# Patient Record
Sex: Male | Born: 1985 | Race: White | Hispanic: No | Marital: Married | Smoking: Current every day smoker
Health system: Southern US, Community
[De-identification: ages and names within clinical notes are randomized; demographics above are authoritative.]

## PROBLEM LIST (undated history)

## (undated) HISTORY — PX: HERNIA REPAIR: SHX51

---

## 2014-07-26 ENCOUNTER — Emergency Department (HOSPITAL_BASED_OUTPATIENT_CLINIC_OR_DEPARTMENT_OTHER)
Admission: EM | Admit: 2014-07-26 | Discharge: 2014-07-26 | Disposition: A | Discharge status: Home / Self Care | Payer: BC Managed Care – PPO | Attending: Emergency Medicine | Admitting: Emergency Medicine

## 2014-07-26 ENCOUNTER — Encounter (HOSPITAL_BASED_OUTPATIENT_CLINIC_OR_DEPARTMENT_OTHER): Payer: Self-pay | Admitting: Emergency Medicine

## 2014-07-26 DIAGNOSIS — Z792 Long term (current) use of antibiotics: Secondary | ICD-10-CM | POA: Insufficient documentation

## 2014-07-26 DIAGNOSIS — F17200 Nicotine dependence, unspecified, uncomplicated: Secondary | ICD-10-CM | POA: Diagnosis not present

## 2014-07-26 DIAGNOSIS — H5789 Other specified disorders of eye and adnexa: Secondary | ICD-10-CM | POA: Diagnosis present

## 2014-07-26 DIAGNOSIS — H109 Unspecified conjunctivitis: Secondary | ICD-10-CM | POA: Diagnosis not present

## 2014-07-26 DIAGNOSIS — Z9889 Other specified postprocedural states: Secondary | ICD-10-CM | POA: Insufficient documentation

## 2014-07-26 MED ORDER — TETRACAINE HCL 0.5 % OP SOLN
2.0000 [drp] | Freq: Once | OPHTHALMIC | Status: AC
  Administered 2014-07-26: 2 [drp] via OPHTHALMIC
  Filled 2014-07-26: qty 2

## 2014-07-26 MED ORDER — MOXIFLOXACIN HCL 0.5 % OP SOLN: 1.0000 [drp] | Freq: Three times a day (TID) | OPHTHALMIC | Status: AC

## 2014-07-26 MED ORDER — FLUORESCEIN SODIUM 1 MG OP STRP
1.0000 | ORAL_STRIP | Freq: Once | OPHTHALMIC | Status: AC
Start: 2014-07-26 — End: 2014-07-26
  Administered 2014-07-26: 1 via OPHTHALMIC
  Filled 2014-07-26: qty 1

## 2014-07-26 NOTE — ED Notes (Signed)
Pt c/o bil eye redness and drainage x 2 days

## 2014-07-26 NOTE — ED Notes (Signed)
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

## 2014-07-26 NOTE — ED Provider Notes (Signed)
CSN: [id number]     Arrival date & time 07/26/14  1914 History  This chart was scribed for Rolan Bucco, MD by Luisa Dago, ED Scribe. This patient was seen in room MH02/MH02 and the patient's care was started at 9:25 PM.    Chief Complaint  Patient presents with  . Eye Drainage   The history is provided by the patient. No language interpreter was used.   HPI Comments: Darrell Sharp is a 28 y.o. male who presents to the Emergency Department complaining of bilateral eye drainage that started approximately 2 days ago.  Pt states that this morning he woke up with crusting to both eyes. However, the day prior he had moderate puss drainage from his left eye.  He endorses associated bilateral eye redness, "glossy" vision to his right eye, and bilateral eye pain that he describes as a "soreness".  Denies any foreign bodies to bilateral eyes, known sick contacts, fever, chills, nausea, wearing contacts, nausea, emesis, chest pain, SOB.   History reviewed. No pertinent past medical history. Past Surgical History  Procedure Laterality Date  . Hernia repair     History reviewed. No pertinent family history. History  Substance Use Topics  . Smoking status: Current Every Day Smoker -- 1.00 packs/day    Types: Cigarettes  . Smokeless tobacco: Not on file  . Alcohol Use: No    Review of Systems  Constitutional: Negative for fever.  HENT: Negative for congestion, postnasal drip, rhinorrhea and sinus pressure.   Eyes: Positive for photophobia, discharge, redness, itching and visual disturbance.  Gastrointestinal: Negative for nausea and vomiting.  Neurological: Negative for dizziness and headaches.      Allergies  Review of patient's allergies indicates no known allergies.  Home Medications   Prior to Admission medications   Medication Sig Start Date End Date Taking? Authorizing Provider  moxifloxacin (VIGAMOX) 0.5 % ophthalmic solution Place 1 drop into both eyes 3 (three) times daily.  07/26/14   Rolan Bucco, MD   Triage vitals:BP 153/80  Pulse 94  Temp(Src) 98 F (36.7 C) (Oral)  Resp 18  Ht 6' (1.829 m)  Wt 170 lb (77.111 kg)  BMI 23.05 kg/m2  SpO2 100%  Physical Exam  HENT:  Head: Normocephalic and atraumatic.  Mouth/Throat: Oropharynx is clear and moist.  No facial rashes  Eyes:  Patient has some erythema to the conjunctiva bilaterally. There some yellow crusting in the corners of both eyes. Tetracaine and 4 seen was placed in the eyes. There is new staining areas. Slit-lamp exam was performed. There is no dendrites.    ED Course  Procedures (including critical care time)  DIAGNOSTIC STUDIES: Oxygen Saturation is 100% on RA, normal by my interpretation.    COORDINATION OF CARE: 9:30 PM- will order a visual acuity test. Pt advised of plan for treatment and pt agrees.  Labs Review Labs Reviewed - No data to display  Imaging Review No results found.   EKG Interpretation None      MDM   Final diagnoses:  Bilateral conjunctivitis    Patient with symptoms suggestive of conjunctivitis. He was started on vigamox eye drops. His visual acuity was 2030 in the right and 20/50 in the left. I gave him a referral to follow up with ophthalmology if his symptoms are not improving in the next 48 hours.  I personally performed the services described in this documentation, which was scribed in my presence. The recorded information has been reviewed and is accurate.    Shawna Orleans  Fredderick Phenix, MD 07/27/14 0981

## 2014-07-26 NOTE — Discharge Instructions (Signed)

## 2016-11-28 ENCOUNTER — Other Ambulatory Visit: Payer: Self-pay | Admitting: Physician Assistant

## 2016-11-28 DIAGNOSIS — K5792 Diverticulitis of intestine, part unspecified, without perforation or abscess without bleeding: Secondary | ICD-10-CM

## 2016-11-28 DIAGNOSIS — R195 Other fecal abnormalities: Secondary | ICD-10-CM

## 2016-11-28 DIAGNOSIS — R1032 Left lower quadrant pain: Secondary | ICD-10-CM

## 2016-11-28 DIAGNOSIS — R1013 Epigastric pain: Secondary | ICD-10-CM

## 2016-12-03 ENCOUNTER — Ambulatory Visit
Admission: RE | Admit: 2016-12-03 | Discharge: 2016-12-03 | Disposition: A | Discharge status: Home / Self Care | Payer: BLUE CROSS/BLUE SHIELD | Source: Ambulatory Visit | Attending: Physician Assistant | Admitting: Physician Assistant

## 2016-12-03 DIAGNOSIS — R195 Other fecal abnormalities: Secondary | ICD-10-CM

## 2016-12-03 DIAGNOSIS — K5792 Diverticulitis of intestine, part unspecified, without perforation or abscess without bleeding: Secondary | ICD-10-CM

## 2016-12-03 DIAGNOSIS — R1032 Left lower quadrant pain: Secondary | ICD-10-CM

## 2016-12-03 DIAGNOSIS — R1013 Epigastric pain: Secondary | ICD-10-CM

## 2016-12-03 MED ORDER — IOPAMIDOL (ISOVUE-300) INJECTION 61%
100.0000 mL | Freq: Once | INTRAVENOUS | Status: AC | PRN
  Administered 2016-12-03: 100 mL via INTRAVENOUS

## 2018-06-11 IMAGING — CT CT ABD-PELV W/ CM
2 of 4 series · 16 of 46 positions shown, 18 images · IV contrast (APPLIED)
Comparison: None.

CLINICAL DATA: Left lower quadrant abdominal pain for 2 months.

EXAM:
CT ABDOMEN AND PELVIS WITH CONTRAST
TECHNIQUE: Multidetector CT imaging of the abdomen and pelvis was performed
using the standard protocol following bolus administration of
intravenous contrast.
CONTRAST:  100mL [id number] IOPAMIDOL ([id number]) INJECTION 61%

[Series 2: abd/pelvis w/cm · axial · 0.67mm/px · z∈[-468,-28]mm · 13 of 96 slices shown, 15 images]
[im 4/96  soft-tissue]
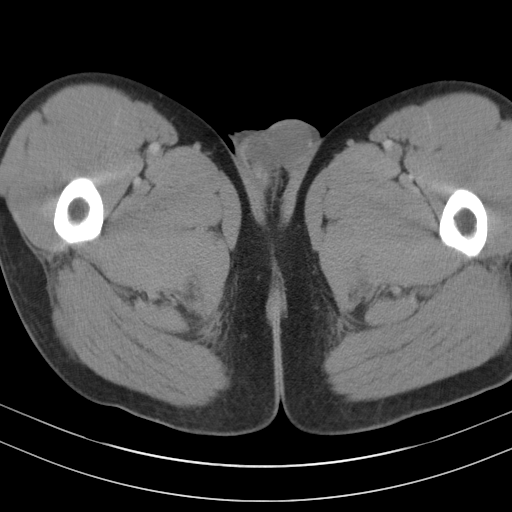
[im 4/96  bone]
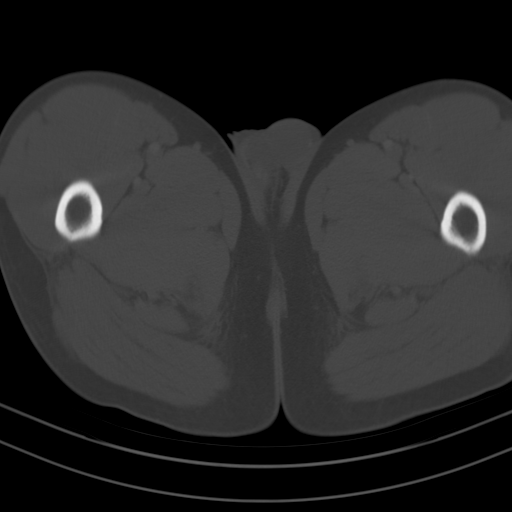
[im 12/96  soft-tissue]
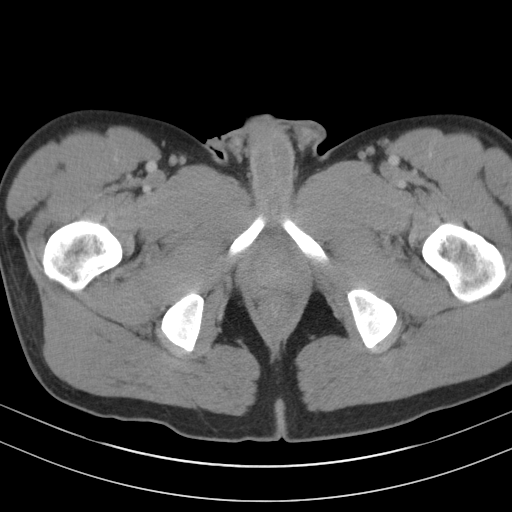
[im 20/96  soft-tissue]
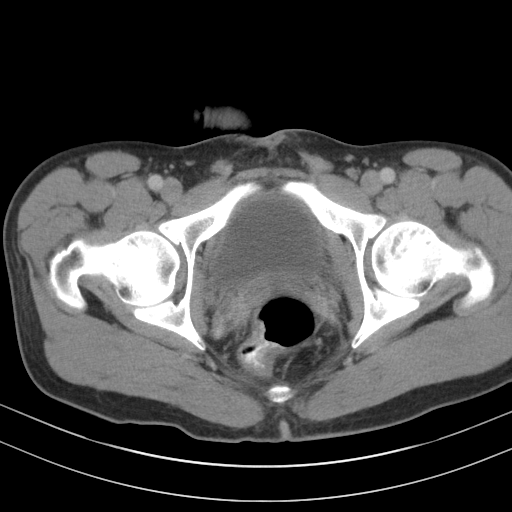
[im 27/96  soft-tissue]
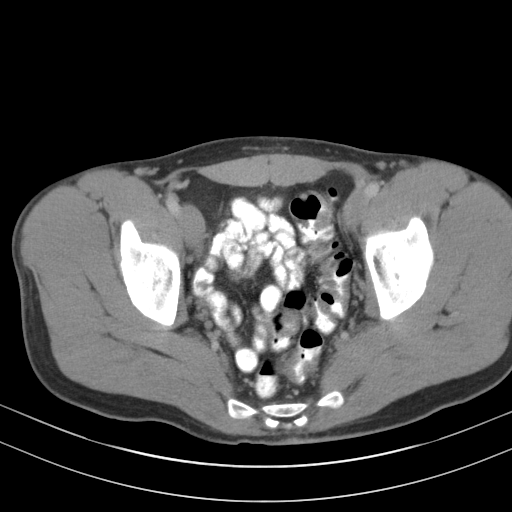
[im 35/96  soft-tissue]
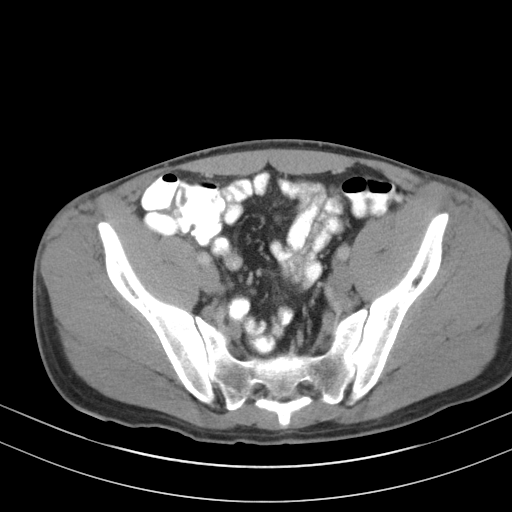
[im 42/96  soft-tissue]
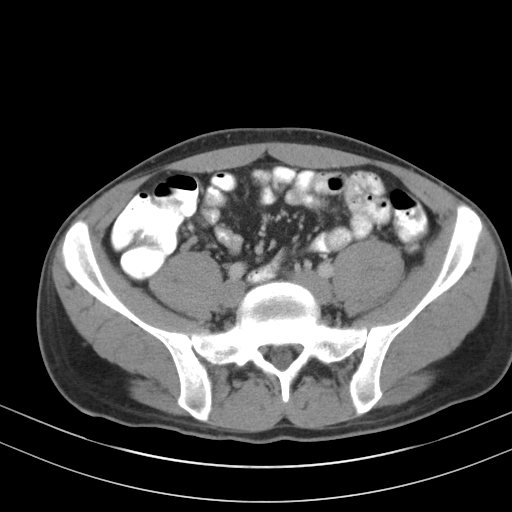
[im 50/96  soft-tissue]
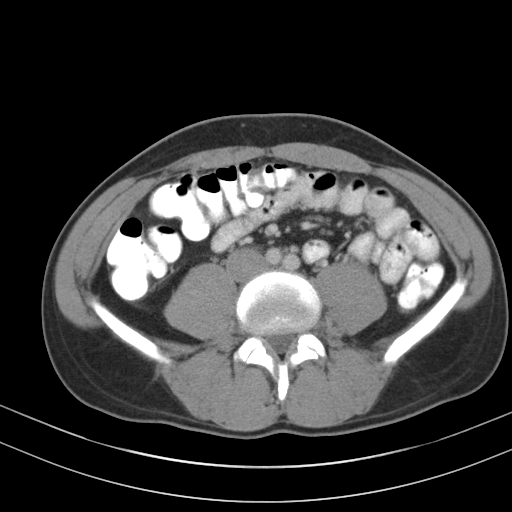
[im 54/96  soft-tissue]
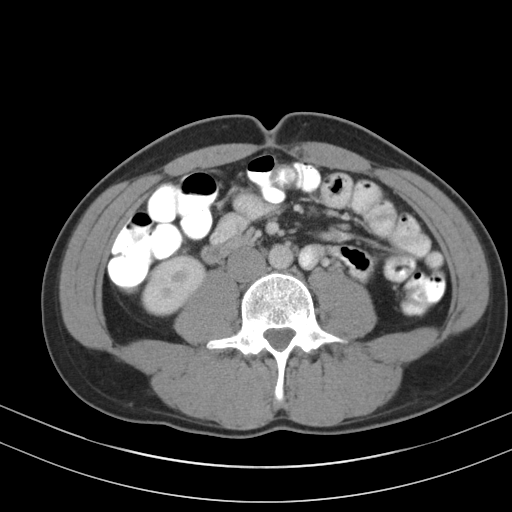
[im 61/96  soft-tissue]
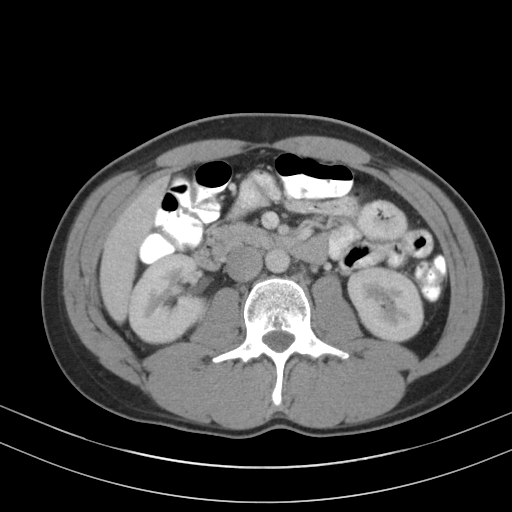
[im 61/96  bone]
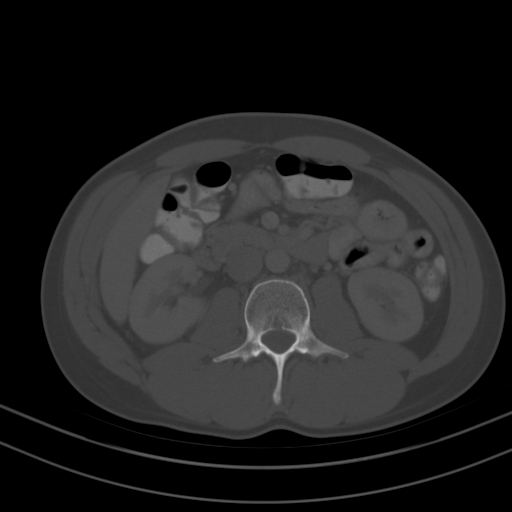
[im 69/96  soft-tissue]
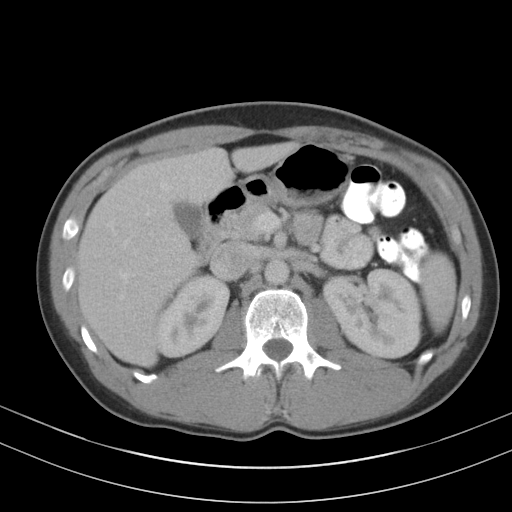
[im 77/96  soft-tissue]
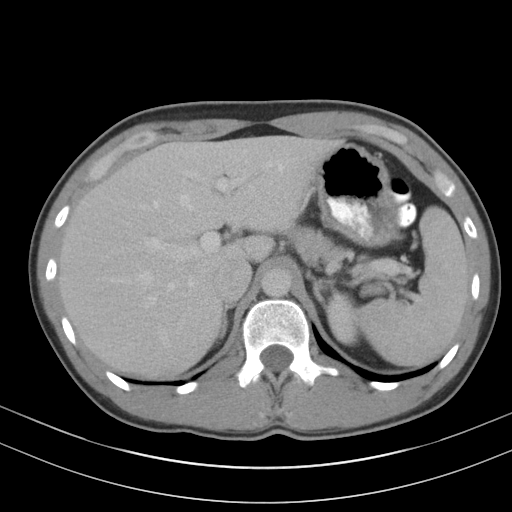
[im 84/96  soft-tissue]
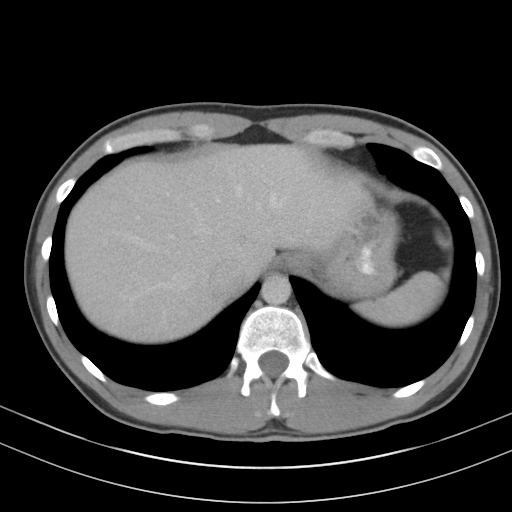
[im 92/96  soft-tissue]
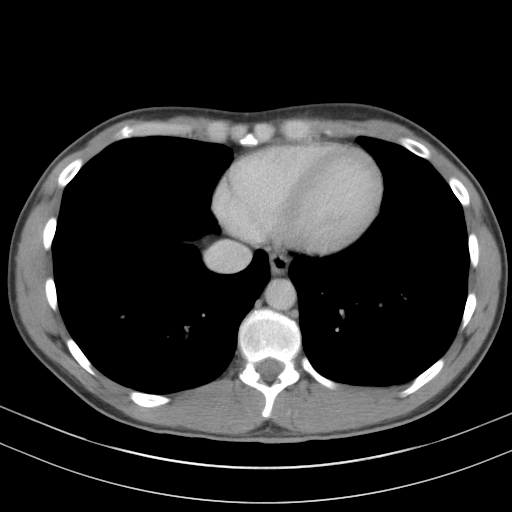

[Series 3: cor · coronal · 0.67mm/px · 3 of 75 slices shown]
[im 25/75  soft-tissue]
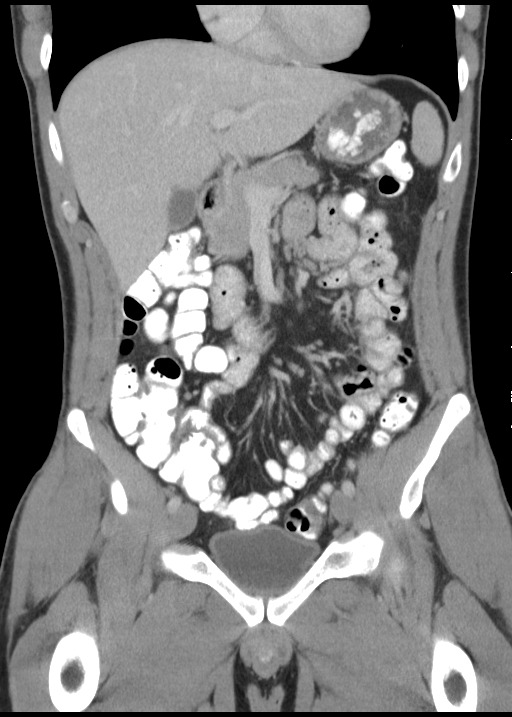
[im 33/75  soft-tissue]
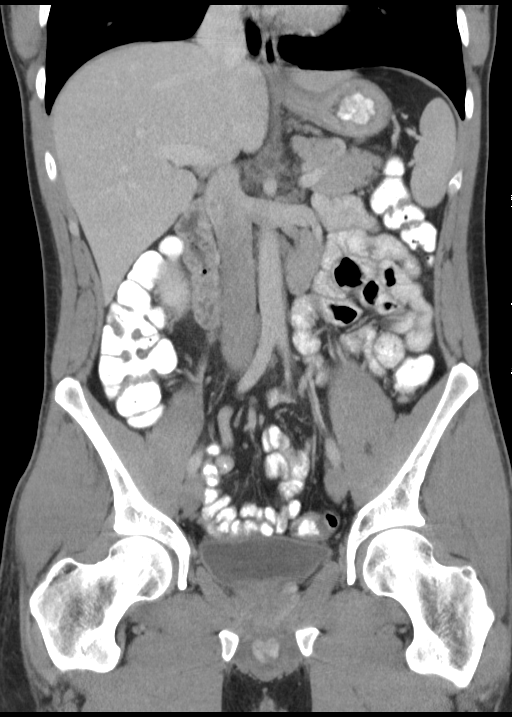
[im 42/75  soft-tissue]
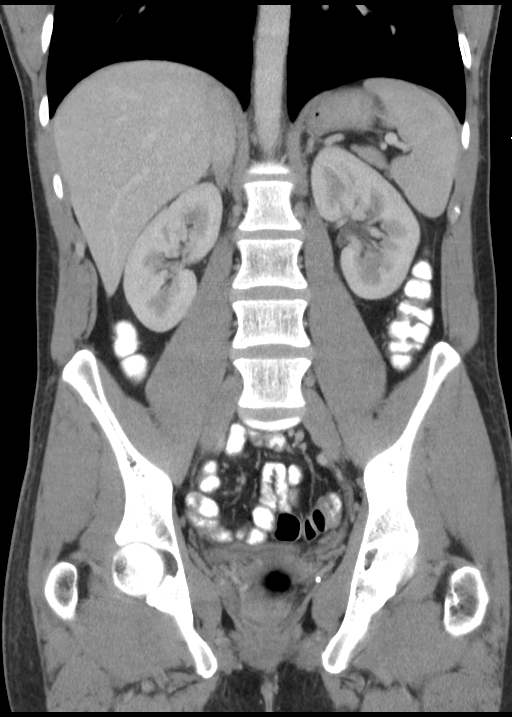

[16 of 46 positions shown; findings below may reference images not displayed]

FINDINGS: Lower chest: No acute abnormality.

Hepatobiliary: No focal liver abnormality is seen. No gallstones,
gallbladder wall thickening, or biliary dilatation.

Pancreas: Unremarkable. No pancreatic ductal dilatation or
surrounding inflammatory changes.

Spleen: Normal in size without focal abnormality.

Adrenals/Urinary Tract: Adrenal glands are unremarkable. Kidneys are
normal, without renal calculi, focal lesion, or hydronephrosis.
Bladder is unremarkable.

Stomach/Bowel: Stomach is within normal limits. Appendix appears
normal. No evidence of bowel wall thickening, distention, or
inflammatory changes.

Vascular/Lymphatic: No significant vascular findings are present. No
enlarged abdominal or pelvic lymph nodes.

Reproductive: Prostate is unremarkable.

Other: No abdominal wall hernia or abnormality. No abdominopelvic
ascites.

Musculoskeletal: No acute or significant osseous findings.
IMPRESSION: No abnormality seen in the abdomen or pelvis.
